# Patient Record
Sex: Male | Born: 1995 | Hispanic: Yes | Marital: Married | Smoking: Never smoker
Health system: Southern US, Community
[De-identification: ages and names within clinical notes are randomized; demographics above are authoritative.]

---

## 2015-09-17 HISTORY — PX: LEG SURGERY: SHX1003

## 2020-01-24 ENCOUNTER — Emergency Department
Admission: EM | Admit: 2020-01-24 | Discharge: 2020-01-24 | Disposition: A | Discharge status: Home / Self Care | Payer: Self-pay | Attending: Emergency Medicine | Admitting: Emergency Medicine

## 2020-01-24 ENCOUNTER — Other Ambulatory Visit: Payer: Self-pay

## 2020-01-24 ENCOUNTER — Emergency Department: Payer: Self-pay

## 2020-01-24 DIAGNOSIS — Y998 Other external cause status: Secondary | ICD-10-CM | POA: Insufficient documentation

## 2020-01-24 DIAGNOSIS — Z23 Encounter for immunization: Secondary | ICD-10-CM | POA: Insufficient documentation

## 2020-01-24 DIAGNOSIS — M25572 Pain in left ankle and joints of left foot: Secondary | ICD-10-CM | POA: Insufficient documentation

## 2020-01-24 DIAGNOSIS — S161XXA Strain of muscle, fascia and tendon at neck level, initial encounter: Secondary | ICD-10-CM | POA: Insufficient documentation

## 2020-01-24 DIAGNOSIS — W2210XA Striking against or struck by unspecified automobile airbag, initial encounter: Secondary | ICD-10-CM | POA: Insufficient documentation

## 2020-01-24 DIAGNOSIS — S80811A Abrasion, right lower leg, initial encounter: Secondary | ICD-10-CM | POA: Insufficient documentation

## 2020-01-24 DIAGNOSIS — Y9389 Activity, other specified: Secondary | ICD-10-CM | POA: Insufficient documentation

## 2020-01-24 DIAGNOSIS — S39012A Strain of muscle, fascia and tendon of lower back, initial encounter: Secondary | ICD-10-CM | POA: Insufficient documentation

## 2020-01-24 DIAGNOSIS — Y92410 Unspecified street and highway as the place of occurrence of the external cause: Secondary | ICD-10-CM | POA: Insufficient documentation

## 2020-01-24 MED ORDER — TETANUS-DIPHTH-ACELL PERTUSSIS 5-2.5-18.5 LF-MCG/0.5 IM SUSP
0.5000 mL | Freq: Once | INTRAMUSCULAR | Status: AC
  Administered 2020-01-24: 0.5 mL via INTRAMUSCULAR
  Filled 2020-01-24: qty 0.5

## 2020-01-24 MED ORDER — BACLOFEN 10 MG PO TABS
10.0000 mg | ORAL_TABLET | Freq: Three times a day (TID) | ORAL | 1 refills | Status: AC
Start: 2020-01-24 — End: 2021-01-23

## 2020-01-24 MED ORDER — MELOXICAM 15 MG PO TABS: 15.0000 mg | ORAL_TABLET | Freq: Every day | ORAL | 2 refills | Status: AC
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
## 2020-01-24 NOTE — Discharge Instructions (Addendum)
Follow-up with orthopedics if not improving in 1 week.  Apply ice to all areas that hurt.  Take medication as prescribed.

## 2020-01-24 NOTE — ED Notes (Signed)
Interpretor used to go over discharge, follow up and prescription information. Pt states no further questions

## 2020-01-24 NOTE — ED Notes (Signed)
Interpretor requested for provided assessment at this time
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
## 2024-08-07 ENCOUNTER — Other Ambulatory Visit: Payer: Self-pay

## 2024-08-07 ENCOUNTER — Emergency Department
Admission: EM | Admit: 2024-08-07 | Discharge: 2024-08-07 | Disposition: A | Discharge status: Home / Self Care | Payer: Self-pay | Attending: Emergency Medicine | Admitting: Emergency Medicine

## 2024-08-07 ENCOUNTER — Emergency Department: Payer: Self-pay

## 2024-08-07 DIAGNOSIS — S61111A Laceration without foreign body of right thumb with damage to nail, initial encounter: Secondary | ICD-10-CM | POA: Insufficient documentation

## 2024-08-07 DIAGNOSIS — S61011A Laceration without foreign body of right thumb without damage to nail, initial encounter: Secondary | ICD-10-CM

## 2024-08-07 DIAGNOSIS — Y990 Civilian activity done for income or pay: Secondary | ICD-10-CM | POA: Insufficient documentation

## 2024-08-07 DIAGNOSIS — S6710XA Crushing injury of unspecified finger(s), initial encounter: Secondary | ICD-10-CM

## 2024-08-07 DIAGNOSIS — W230XXA Caught, crushed, jammed, or pinched between moving objects, initial encounter: Secondary | ICD-10-CM | POA: Insufficient documentation

## 2024-08-07 MED ORDER — OXYCODONE HCL 5 MG PO TABS: 5.0000 mg | ORAL_TABLET | Freq: Three times a day (TID) | ORAL | 0 refills | Status: DC | PRN

## 2024-08-07 MED ORDER — CEPHALEXIN 500 MG PO CAPS: 500.0000 mg | ORAL_CAPSULE | Freq: Three times a day (TID) | ORAL | 0 refills | Status: AC

## 2024-08-07 MED ORDER — CEFAZOLIN SODIUM-DEXTROSE 1-4 GM/50ML-% IV SOLN
1.0000 g | Freq: Once | INTRAVENOUS | Status: AC
  Administered 2024-08-07: 1 g via INTRAVENOUS
  Filled 2024-08-07: qty 50

## 2024-08-07 MED ORDER — LIDOCAINE HCL (PF) 1 % IJ SOLN
10.0000 mL | Freq: Once | INTRAMUSCULAR | Status: AC
  Administered 2024-08-07: 10 mL
  Filled 2024-08-07: qty 10

## 2024-08-07 MED ORDER — HYDROMORPHONE HCL 1 MG/ML IJ SOLN
1.0000 mg | Freq: Once | INTRAMUSCULAR | Status: AC
  Administered 2024-08-07: 1 mg via INTRAVENOUS
  Filled 2024-08-07: qty 1

## 2024-08-07 NOTE — Discharge Instructions (Addendum)
 Call the Hand Surgeon on Monday to schedule clinic appointment.   Antibiotics 3 times daily for 7 days to prevent infection  Keep the bandage in place, clean and dry  Please take Tylenol and ibuprofen/Advil for your pain.  It is safe to take them together, or to alternate them every few hours.  Take up to 1000mg  of Tylenol at a time, up to 4 times per day.  Do not take more than 4000 mg of Tylenol in 24 hours.  For ibuprofen, take 400-600 mg, 3 - 4 times per day.  Oxycodone  as needed for more severe pain

## 2024-08-07 NOTE — ED Triage Notes (Signed)
 Pt presents with crush injury to right thumb from a metal bending machine. Tetanus within last 2 years. Dressing placed in triage.   Pt requires a spanish speaking interpreter
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
## 2024-08-18 ENCOUNTER — Encounter: Payer: Self-pay | Admitting: Family Medicine

## 2024-08-18 ENCOUNTER — Ambulatory Visit: Payer: Self-pay | Admitting: Family Medicine

## 2024-08-18 VITALS — BP 104/68 | HR 89 | Ht 65.0 in | Wt 189.0 lb

## 2024-08-18 DIAGNOSIS — S6701XD Crushing injury of right thumb, subsequent encounter: Secondary | ICD-10-CM

## 2024-08-18 DIAGNOSIS — S6721XD Crushing injury of right hand, subsequent encounter: Secondary | ICD-10-CM

## 2024-08-18 DIAGNOSIS — S6702XD Crushing injury of left thumb, subsequent encounter: Secondary | ICD-10-CM

## 2024-08-19 MED ORDER — OXYCODONE-ACETAMINOPHEN 5-325 MG PO TABS: 1.0000 | ORAL_TABLET | Freq: Three times a day (TID) | ORAL | 0 refills | Status: DC | PRN

## 2024-08-20 ENCOUNTER — Telehealth: Payer: Self-pay | Admitting: Family Medicine

## 2024-08-20 DIAGNOSIS — S6702XD Crushing injury of left thumb, subsequent encounter: Secondary | ICD-10-CM | POA: Insufficient documentation

## 2024-08-20 NOTE — Assessment & Plan Note (Signed)
 Has granulation tissue where the nail was avulsed.  Incisions are clean dry and intact and sutures are present.  Cleaned with normal saline, coated with Neosporin and covered.  Ask him to wash his finger every day cover the wound with Neosporin and gauze.  Follow-up in a week to have your sutures removed

## 2024-08-20 NOTE — Telephone Encounter (Signed)
 Copied from CRM [id number]. Topic: Clinical - Medication Question >> Aug 19, 2024  3:09 PM Delon DASEN wrote: Reason for CRM: Patient states he was supposed to get more pain meds called in, nothing at pharmacy has been called in- [phone]- he was seen in the office yesterday

## 2024-08-24 NOTE — Telephone Encounter (Signed)
 LVM using [employer] [phone] to call office to ask about below.  The medication was [facility] [place] on 08/19/24    Ziglar, Susan K, MD to [facility] (Selected Message)     08/24/24  1:37 PM Please find out which pharmacy he is going to.  I sent in pain medication for him.  Thanks

## 2024-08-27 ENCOUNTER — Encounter: Payer: Self-pay | Admitting: Family Medicine

## 2024-08-27 ENCOUNTER — Ambulatory Visit: Payer: Self-pay | Admitting: Family Medicine

## 2024-08-27 VITALS — BP 121/79 | HR 84 | Ht 65.0 in | Wt 187.0 lb

## 2024-08-29 DIAGNOSIS — S6702XD Crushing injury of left thumb, subsequent encounter: Secondary | ICD-10-CM

## 2024-08-29 NOTE — Assessment & Plan Note (Signed)
 Removed 4 sutures without difficulty today.  Continue to use Neosporin and keep wound covered.  Wash daily and cover with Neosporin.  Follow-up in a week and we will try to get these last 2 stitches out

## 2024-08-29 NOTE — Progress Notes (Signed)
° °  Established Patient Office Visit  Subjective   Patient ID: Victor Golden, male    DOB: Feb 22, 1996  Age: 28 y.o. MRN: [id number]  Chief Complaint  Patient presents with   Medical Management of Chronic Issues    HPI Crush injury to his right thumb 08/07/2024.  In today for suture removal.  He has been washing his thumb every day and covering with Neosporin and gauze.  It is much less painful than it was.    ROS    Objective:     BP 121/79   Pulse 84   Ht 5' 5 (1.651 m)   Wt 187 lb (84.8 kg)   SpO2 96%   BMI 31.12 kg/m    Physical Exam Vitals reviewed.  Constitutional:      Appearance: Normal appearance.  HENT:     Head: Normocephalic.  Eyes:     General:        Right eye: No discharge.        Left eye: No discharge.  Cardiovascular:     Rate and Rhythm: Normal rate.  Pulmonary:     Effort: Pulmonary effort is normal.  Musculoskeletal:     Comments: Granulomatous tissue at the base of his thumbnail incisions are clean dry and intact.  Was able to remove 4 sutures today but there are 2 loose ends in the pad of his thumb that will not release when tugged on  Neurological:     Mental Status: He is alert and oriented to person, place, and time.  Psychiatric:        Mood and Affect: Mood normal.        Behavior: Behavior normal.        Thought Content: Thought content normal.        Judgment: Judgment normal.    Suture Removal  Date/Time: 08/29/2024 5:41 PM  Performed by: Laruen Risser K, MD Authorized by: Matilde Pottenger K, MD  Body area: upper extremity Location details: right thumb Wound Appearance: clean and tender Post-removal: dressing applied and antibiotic ointment applied Patient tolerance: patient tolerated the procedure well with no immediate complications Comments: 2 suture ends cannot be removed.  Continue cleaning daily and covering with Neosporin which we can find the original ends for these.  Follow-up in a week.         No  results found for any visits on 08/27/24.    The ASCVD Risk score (Arnett DK, et al., 2019) failed to calculate for the following reasons:   The 2019 ASCVD risk score is only valid for ages 36 to 31   * - Cholesterol units were assumed    Assessment & Plan:  Crush injury to thumb, left, subsequent encounter Assessment & Plan: Removed 4 sutures without difficulty today.  Continue to use Neosporin and keep wound covered.  Wash daily and cover with Neosporin.  Follow-up in a week and we will try to get these last 2 stitches out      No follow-ups on file.    Trason Shifflet K Normagene Harvie, MD

## 2024-09-07 ENCOUNTER — Ambulatory Visit: Payer: Self-pay | Admitting: Family Medicine

## 2024-10-21 DIAGNOSIS — S6701XD Crushing injury of right thumb, subsequent encounter: Secondary | ICD-10-CM | POA: Insufficient documentation

## 2024-10-21 NOTE — Assessment & Plan Note (Signed)
 Has granulation tissue where the nail was avulsed.  Incisions are clean dry and intact and sutures are present.  Cleaned with normal saline, coated with Neosporin and covered.  Ask him to wash his finger every day cover the wound with Neosporin and gauze.  Follow-up in a week to have your sutures removed
